# Patient Record
Sex: Male | Born: 1995 | Race: White | Hispanic: No | Marital: Single | Smoking: Never smoker
Health system: Southern US, Community
[De-identification: ages and names within clinical notes are randomized; demographics above are authoritative.]

## PROBLEM LIST (undated history)

## (undated) DIAGNOSIS — Z789 Other specified health status: Secondary | ICD-10-CM

## (undated) HISTORY — PX: NO PAST SURGERIES: SHX2092

---

## 2017-04-07 ENCOUNTER — Telehealth: Payer: Self-pay | Admitting: Sports Medicine

## 2017-04-07 NOTE — Telephone Encounter (Signed)
  [employer] Training Room Note Veverly FellsMichael D. Delorise Shinerigby, DO  [facility] [place] [phone]  Harrell GaveJack Granito - 22 y.o. male MRN [id number]  Date of birth: 06/27/1995  Visit Date: 04/07/17  SUBJECTIVE:  CC: Back pain HPI: 1-1/2 years of low back pain that is worse with increasing tennis activities.  He has worse pain with serving.  He has not changed his stroke recently and has had intermittent issues with this over the past year and a half that responded mildly to chiropractic manipulation in the past.  No prior back issues that he is aware of.  Currently this time the pain is limiting his day-to-day function but in a minimal way; he does report it occasionally keeps him awake at night.    ROS: Negative red flag otherwise per HPI.  OBJECTIVE:  Back is overall well aligned and surprisingly he has very few paraspinal muscle spasms.  He has good lumbar rotation and good paraspinal movement.  While laying in a decubitus position he has markedly tight hip flexors when isolating him at the femoral acetabular joint but he is able to fully extend his knee to greater than 30 degrees of extension due to lumbar hypermobility.  He has significant lower extremity dermatomal dysesthesia.  Mild pain with bilateral stork test that localizes to the right greater than left sides but is present bilaterally.  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  X-rays have been ordered.  Gentle osteopathic manipulation performed today and recommended to begin working on hip hinging exercises.  We will plan to check in with him in 2 weeks and check on his progress and review the x-rays at that time.  If he is having persistent symptoms x-rays are unrevealing could consider  an MRI given the duration of symptoms.  Core stabilization exercises are likely the solution to this and I would like for him to be performing them frequently throughout the day on a daily basis.  Also recommend that he interrupt his prolonged sitting.        Andrena MewsMichael D Rigby, DO    [facility]

## 2017-04-21 ENCOUNTER — Telehealth: Payer: Self-pay | Admitting: Sports Medicine

## 2017-04-21 DIAGNOSIS — M545 Low back pain, unspecified: Secondary | ICD-10-CM

## 2017-04-22 ENCOUNTER — Ambulatory Visit: Payer: Self-pay

## 2017-04-22 ENCOUNTER — Other Ambulatory Visit: Payer: Self-pay | Admitting: Sports Medicine

## 2017-04-22 DIAGNOSIS — R52 Pain, unspecified: Secondary | ICD-10-CM

## 2017-04-25 ENCOUNTER — Other Ambulatory Visit: Payer: Self-pay | Admitting: Sports Medicine

## 2017-04-25 ENCOUNTER — Telehealth: Payer: Self-pay | Admitting: Sports Medicine

## 2017-04-25 DIAGNOSIS — M545 Low back pain, unspecified: Secondary | ICD-10-CM

## 2017-04-25 NOTE — Telephone Encounter (Signed)
Error

## 2017-04-25 NOTE — Telephone Encounter (Signed)
  [employer] Training Room Note Veverly FellsMichael D. Delorise Shinerigby, DO  [facility] [place] [phone]  Harrell GaveJack Chianese - 22 y.o. male MRN [id number]  Date of birth: 10/17/1995  Visit Date: 04/21/2017  PCP: No primary care provider on file.   Referred by: No ref. provider found  SUBJECTIVE:  CC: Left low back pain HPI: Persistent left low back pain that is been present for the past 2 years.  He has tried Journalist, newspaperosteopathic manipulation, chiropractic, therapeutic exercises, modalities in the athletic training room and anti-inflammatory medications.  The pain does seem to be worse when playing tennis and extending backwards.  Plain film x-rays obtained and reviewed as below. ROS: Pt denies any change in bowel or bladder habits, muscle weakness, numbness or falls associated with this pain.  Otherwise per HPI.  OBJECTIVE:  Back is overall well aligned with left paraspinal muscle spasms.  TTP over the thoracolumbar junction pain is most focal over the left paraspinal musculature at the level of L1.  He has a negative straight leg raise bilaterally.  Tight hip flexors left greater than right.  IMAGING & PROCEDURES: Complete lumbar spine films: Obtained [employer] health, uploaded to [employer].  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  2 years of symptoms.  He has tried therapeutic exercises medications and is having persistent worsening pain.  Further diagnostic evaluation with MRI of the lumbar spine with thin slices through L1.  We will plan to follow-up with him after this is obtained.  Meds: No orders of the defined types were placed in this encounter.   Orders: No orders of the defined types were placed in this encounter.        Andrena MewsMichael D Rigby,  DO    [facility]

## 2017-05-02 ENCOUNTER — Ambulatory Visit
Admission: RE | Admit: 2017-05-02 | Discharge: 2017-05-02 | Disposition: A | Discharge status: Home / Self Care | Payer: 59 | Source: Ambulatory Visit | Attending: Sports Medicine | Admitting: Sports Medicine

## 2017-05-02 DIAGNOSIS — M545 Low back pain, unspecified: Secondary | ICD-10-CM

## 2018-10-26 ENCOUNTER — Other Ambulatory Visit: Payer: Self-pay

## 2018-10-26 DIAGNOSIS — Z20822 Contact with and (suspected) exposure to covid-19: Secondary | ICD-10-CM

## 2018-10-28 LAB — NOVEL CORONAVIRUS, NAA: SARS-CoV-2, NAA: NOT DETECTED

## 2018-10-28 LAB — SPECIMEN STATUS REPORT

## 2018-11-08 ENCOUNTER — Emergency Department (HOSPITAL_COMMUNITY): Payer: 59

## 2018-11-08 ENCOUNTER — Emergency Department (HOSPITAL_COMMUNITY)
Admission: EM | Admit: 2018-11-08 | Discharge: 2018-11-09 | Disposition: A | Discharge status: Home / Self Care | Payer: 59 | Attending: Emergency Medicine | Admitting: Emergency Medicine

## 2018-11-08 DIAGNOSIS — M25511 Pain in right shoulder: Secondary | ICD-10-CM | POA: Diagnosis present

## 2018-11-08 DIAGNOSIS — S42021A Displaced fracture of shaft of right clavicle, initial encounter for closed fracture: Secondary | ICD-10-CM | POA: Insufficient documentation

## 2018-11-08 DIAGNOSIS — S42022A Displaced fracture of shaft of left clavicle, initial encounter for closed fracture: Secondary | ICD-10-CM | POA: Diagnosis not present

## 2018-11-08 DIAGNOSIS — Y999 Unspecified external cause status: Secondary | ICD-10-CM | POA: Diagnosis not present

## 2018-11-08 DIAGNOSIS — S40211A Abrasion of right shoulder, initial encounter: Secondary | ICD-10-CM | POA: Insufficient documentation

## 2018-11-08 DIAGNOSIS — Y929 Unspecified place or not applicable: Secondary | ICD-10-CM | POA: Diagnosis not present

## 2018-11-08 DIAGNOSIS — Y9355 Activity, bike riding: Secondary | ICD-10-CM | POA: Insufficient documentation

## 2018-11-08 MED ORDER — ONDANSETRON HCL 4 MG/2ML IJ SOLN
4.0000 mg | Freq: Once | INTRAMUSCULAR | Status: AC
  Administered 2018-11-08: 4 mg via INTRAVENOUS

## 2018-11-08 MED ORDER — ONDANSETRON HCL 4 MG/2ML IJ SOLN
INTRAMUSCULAR | Status: AC
  Filled 2018-11-08: qty 2

## 2018-11-08 MED ORDER — MORPHINE SULFATE (PF) 4 MG/ML IV SOLN
INTRAVENOUS | Status: AC
  Filled 2018-11-08: qty 1

## 2018-11-08 MED ORDER — MORPHINE SULFATE (PF) 4 MG/ML IV SOLN
4.0000 mg | Freq: Once | INTRAVENOUS | Status: AC
  Administered 2018-11-08: 22:00:00 4 mg via INTRAVENOUS

## 2018-11-08 MED ORDER — MORPHINE SULFATE 15 MG PO TABS: 15.0000 mg | ORAL_TABLET | ORAL | 0 refills | Status: AC | PRN

## 2018-11-08 MED ORDER — MORPHINE SULFATE (PF) 4 MG/ML IV SOLN
4.0000 mg | Freq: Once | INTRAVENOUS | Status: AC
  Administered 2018-11-08: 4 mg via INTRAVENOUS
  Filled 2018-11-08: qty 1

## 2018-11-08 NOTE — Discharge Instructions (Addendum)
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
## 2018-11-08 NOTE — ED Notes (Signed)
[phone] Elmyra Ricks pts significant other
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
## 2018-11-09 NOTE — ED Notes (Signed)
Pt discharged from ED; instructions provided and scripts given; Pt encouraged to return to ED if symptoms worsen and to f/u with PCP; Pt verbalized understanding of all instructions 

## 2018-11-13 ENCOUNTER — Other Ambulatory Visit (HOSPITAL_COMMUNITY)
Admission: RE | Admit: 2018-11-13 | Discharge: 2018-11-13 | Disposition: A | Discharge status: Home / Self Care | Payer: 59 | Source: Ambulatory Visit | Attending: Orthopaedic Surgery | Admitting: Orthopaedic Surgery

## 2018-11-13 DIAGNOSIS — Z01812 Encounter for preprocedural laboratory examination: Secondary | ICD-10-CM | POA: Diagnosis not present

## 2018-11-13 DIAGNOSIS — Z20828 Contact with and (suspected) exposure to other viral communicable diseases: Secondary | ICD-10-CM | POA: Insufficient documentation

## 2018-11-14 ENCOUNTER — Encounter (HOSPITAL_COMMUNITY): Payer: Self-pay | Admitting: *Deleted

## 2018-11-14 ENCOUNTER — Other Ambulatory Visit: Payer: Self-pay

## 2018-11-14 LAB — NOVEL CORONAVIRUS, NAA (HOSP ORDER, SEND-OUT TO REF LAB; TAT 18-24 HRS): SARS-CoV-2, NAA: NOT DETECTED

## 2018-11-14 NOTE — Progress Notes (Signed)
Daniel Farrell denies chest pain or shortness of breath.  Patient tested negative for Covid 11/13/2018 and has been in quarantine since. I instructed patient to not eat after midnight Wednesday. I instructed patient the can drink clear liquids until 0900.  We reviewed what clear liquids are. Daniel Farrell states that he can have someone pick up Pre -surgery Ensure tomorrow. I will place instructions with drink.

## 2018-11-14 NOTE — Pre-Procedure Instructions (Signed)
    Daniel Farrell  11/14/2018      Your procedure is scheduled on Thursday, September 17..  Report to [facility], Main Entrance or Entrance "A" at 9:30 A.M.  Call this number if you have problems the morning of surgery: [phone]  This is the number for the Pre- Surgical Desk.     Remember:  Do not eat  after midnight Wednesday. You may drink clear liquids until 9:00 AM .  Clear liquids allowed are:                   Water, Juice (non-citric and without pulp), Carbonated beverages, Clear Tea, Black Coffee only, Plain Jell-O only, Gatorade and Plain Popsicles only  Drink the Pre- Surgery Ensure between 8:30 and 9:00 AM   Take these medicines the morning of surgery with A SIP OF WATER if needed: Tylenol Morphine IR  Wash your body, with antibiotic soap if available. Brush teeth Wear clean clothes to the hospital.  Do not wear jewelry, make-up or nail polish.  Do not wear lotions, powders, or perfumes, or deodorant.   Men may shave face and neck.  Do not bring valuables to the hospital.  [facility] is not responsible for any belongings or valuables.

## 2018-11-15 DIAGNOSIS — S42021A Displaced fracture of shaft of right clavicle, initial encounter for closed fracture: Secondary | ICD-10-CM

## 2018-11-16 ENCOUNTER — Ambulatory Visit (HOSPITAL_COMMUNITY): Payer: 59 | Admitting: Anesthesiology

## 2018-11-16 ENCOUNTER — Encounter (HOSPITAL_COMMUNITY)
Admission: RE | Disposition: A | Discharge status: Home / Self Care | Payer: Self-pay | Source: Home / Self Care | Attending: Orthopaedic Surgery

## 2018-11-16 ENCOUNTER — Ambulatory Visit (HOSPITAL_COMMUNITY): Payer: 59

## 2018-11-16 ENCOUNTER — Other Ambulatory Visit: Payer: Self-pay

## 2018-11-16 ENCOUNTER — Encounter (HOSPITAL_COMMUNITY): Payer: Self-pay | Admitting: *Deleted

## 2018-11-16 ENCOUNTER — Ambulatory Visit (HOSPITAL_COMMUNITY)
Admission: RE | Admit: 2018-11-16 | Discharge: 2018-11-16 | Disposition: A | Discharge status: Home / Self Care | Payer: 59 | Attending: Orthopaedic Surgery | Admitting: Orthopaedic Surgery

## 2018-11-16 DIAGNOSIS — X58XXXA Exposure to other specified factors, initial encounter: Secondary | ICD-10-CM | POA: Diagnosis not present

## 2018-11-16 DIAGNOSIS — S42021A Displaced fracture of shaft of right clavicle, initial encounter for closed fracture: Secondary | ICD-10-CM

## 2018-11-16 DIAGNOSIS — Z419 Encounter for procedure for purposes other than remedying health state, unspecified: Secondary | ICD-10-CM

## 2018-11-16 DIAGNOSIS — Z9889 Other specified postprocedural states: Secondary | ICD-10-CM

## 2018-11-16 HISTORY — DX: Other specified health status: Z78.9

## 2018-11-16 HISTORY — PX: ORIF CLAVICULAR FRACTURE: SHX5055

## 2018-11-16 LAB — BASIC METABOLIC PANEL
Anion gap: 11 (ref 5–15)
BUN: 18 mg/dL (ref 6–20)
CO2: 24 mmol/L (ref 22–32)
Calcium: 9.5 mg/dL (ref 8.9–10.3)
Chloride: 106 mmol/L (ref 98–111)
Creatinine, Ser: 0.77 mg/dL (ref 0.61–1.24)
GFR calc Af Amer: >60 mL/min (ref >60)
GFR calc non Af Amer: >60 mL/min (ref >60)
Glucose, Bld: 79 mg/dL (ref 70–99)
Potassium: 4 mmol/L (ref 3.5–5.1)
Sodium: 141 mmol/L (ref 135–145)

## 2018-11-16 LAB — HEMOGLOBIN: Hemoglobin: 15.4 g/dL (ref 13.0–17.0)

## 2018-11-16 SURGERY — OPEN REDUCTION INTERNAL FIXATION (ORIF) CLAVICULAR FRACTURE
Anesthesia: General | Site: Shoulder | Laterality: Right

## 2018-11-16 MED ORDER — FENTANYL CITRATE (PF) 100 MCG/2ML IJ SOLN
INTRAMUSCULAR | Status: AC
  Filled 2018-11-16: qty 2

## 2018-11-16 MED ORDER — OXYCODONE HCL 5 MG/5ML PO SOLN: 5.0000 mg | Freq: Once | ORAL | Status: AC | PRN

## 2018-11-16 MED ORDER — KETOROLAC TROMETHAMINE 10 MG PO TABS: 10.0000 mg | ORAL_TABLET | Freq: Two times a day (BID) | ORAL | 0 refills | Status: AC | PRN

## 2018-11-16 MED ORDER — ROCURONIUM BROMIDE 10 MG/ML (PF) SYRINGE
PREFILLED_SYRINGE | INTRAVENOUS | Status: DC | PRN
  Administered 2018-11-16: 30 mg via INTRAVENOUS
  Administered 2018-11-16: 10 mg via INTRAVENOUS

## 2018-11-16 MED ORDER — MIDAZOLAM HCL 2 MG/2ML IJ SOLN
INTRAMUSCULAR | Status: AC
  Filled 2018-11-16: qty 2

## 2018-11-16 MED ORDER — ACETAMINOPHEN 10 MG/ML IV SOLN
1000.0000 mg | Freq: Once | INTRAVENOUS | Status: AC
  Administered 2018-11-16: 1000 mg via INTRAVENOUS
  Filled 2018-11-16: qty 100

## 2018-11-16 MED ORDER — CEFAZOLIN SODIUM-DEXTROSE 2-4 GM/100ML-% IV SOLN
INTRAVENOUS | Status: AC
  Filled 2018-11-16: qty 100

## 2018-11-16 MED ORDER — ROCURONIUM BROMIDE 10 MG/ML (PF) SYRINGE
PREFILLED_SYRINGE | INTRAVENOUS | Status: AC
  Filled 2018-11-16: qty 10

## 2018-11-16 MED ORDER — LACTATED RINGERS IV SOLN
INTRAVENOUS | Status: DC
  Administered 2018-11-16 (×2): via INTRAVENOUS

## 2018-11-16 MED ORDER — KETOROLAC TROMETHAMINE 30 MG/ML IJ SOLN
INTRAMUSCULAR | Status: DC | PRN
  Administered 2018-11-16: 30 mg via INTRAVENOUS

## 2018-11-16 MED ORDER — ACETAMINOPHEN 10 MG/ML IV SOLN
INTRAVENOUS | Status: AC
  Filled 2018-11-16: qty 100

## 2018-11-16 MED ORDER — FENTANYL CITRATE (PF) 100 MCG/2ML IJ SOLN
25.0000 ug | INTRAMUSCULAR | Status: DC | PRN
  Administered 2018-11-16: 25 ug via INTRAVENOUS

## 2018-11-16 MED ORDER — OXYCODONE HCL 5 MG PO TABS
5.0000 mg | ORAL_TABLET | Freq: Once | ORAL | Status: AC | PRN
  Administered 2018-11-16: 5 mg via ORAL

## 2018-11-16 MED ORDER — FENTANYL CITRATE (PF) 100 MCG/2ML IJ SOLN
INTRAMUSCULAR | Status: DC | PRN
  Administered 2018-11-16 (×4): 50 ug via INTRAVENOUS

## 2018-11-16 MED ORDER — METHOCARBAMOL 750 MG PO TABS: 750.0000 mg | ORAL_TABLET | Freq: Two times a day (BID) | ORAL | 0 refills | Status: AC | PRN

## 2018-11-16 MED ORDER — ONDANSETRON HCL 4 MG/2ML IJ SOLN
INTRAMUSCULAR | Status: DC | PRN
  Administered 2018-11-16: 4 mg via INTRAVENOUS

## 2018-11-16 MED ORDER — CHLORHEXIDINE GLUCONATE 4 % EX LIQD: 60.0000 mL | Freq: Once | CUTANEOUS | Status: DC

## 2018-11-16 MED ORDER — ONDANSETRON HCL 4 MG/2ML IJ SOLN
INTRAMUSCULAR | Status: AC
  Filled 2018-11-16: qty 2

## 2018-11-16 MED ORDER — ZINC SULFATE 220 (50 ZN) MG PO CAPS: 220.0000 mg | ORAL_CAPSULE | Freq: Every day | ORAL | 0 refills | Status: AC

## 2018-11-16 MED ORDER — PROPOFOL 10 MG/ML IV BOLUS
INTRAVENOUS | Status: AC
  Filled 2018-11-16: qty 40

## 2018-11-16 MED ORDER — ONDANSETRON HCL 4 MG/2ML IJ SOLN: 4.0000 mg | Freq: Once | INTRAMUSCULAR | Status: DC | PRN

## 2018-11-16 MED ORDER — ACETAMINOPHEN 160 MG/5ML PO SOLN: 325.0000 mg | ORAL | Status: DC | PRN

## 2018-11-16 MED ORDER — OXYCODONE HCL 5 MG PO TABS: 5.0000 mg | ORAL_TABLET | Freq: Three times a day (TID) | ORAL | 0 refills | Status: AC | PRN

## 2018-11-16 MED ORDER — MIDAZOLAM HCL 5 MG/5ML IJ SOLN
INTRAMUSCULAR | Status: DC | PRN
  Administered 2018-11-16: 2 mg via INTRAVENOUS

## 2018-11-16 MED ORDER — DEXAMETHASONE SODIUM PHOSPHATE 10 MG/ML IJ SOLN
INTRAMUSCULAR | Status: DC | PRN
  Administered 2018-11-16: 10 mg via INTRAVENOUS

## 2018-11-16 MED ORDER — OXYCODONE HCL 5 MG PO TABS
ORAL_TABLET | ORAL | Status: AC
  Filled 2018-11-16: qty 1

## 2018-11-16 MED ORDER — DEXAMETHASONE SODIUM PHOSPHATE 10 MG/ML IJ SOLN
INTRAMUSCULAR | Status: AC
  Filled 2018-11-16: qty 1

## 2018-11-16 MED ORDER — FENTANYL CITRATE (PF) 250 MCG/5ML IJ SOLN
INTRAMUSCULAR | Status: AC
  Filled 2018-11-16: qty 5

## 2018-11-16 MED ORDER — LIDOCAINE 2% (20 MG/ML) 5 ML SYRINGE
INTRAMUSCULAR | Status: DC | PRN
  Administered 2018-11-16: 80 mg via INTRAVENOUS

## 2018-11-16 MED ORDER — BUPIVACAINE-EPINEPHRINE 0.25% -1:200000 IJ SOLN
INTRAMUSCULAR | Status: AC
  Filled 2018-11-16: qty 1

## 2018-11-16 MED ORDER — SUGAMMADEX SODIUM 200 MG/2ML IV SOLN
INTRAVENOUS | Status: DC | PRN
  Administered 2018-11-16: 150 mg via INTRAVENOUS

## 2018-11-16 MED ORDER — 0.9 % SODIUM CHLORIDE (POUR BTL) OPTIME
TOPICAL | Status: DC | PRN
  Administered 2018-11-16 (×3): 1000 mL

## 2018-11-16 MED ORDER — CEFAZOLIN SODIUM-DEXTROSE 2-4 GM/100ML-% IV SOLN
2.0000 g | INTRAVENOUS | Status: AC
  Administered 2018-11-16: 12:00:00 2 g via INTRAVENOUS

## 2018-11-16 MED ORDER — KETOROLAC TROMETHAMINE 30 MG/ML IJ SOLN
INTRAMUSCULAR | Status: AC
  Filled 2018-11-16: qty 1

## 2018-11-16 MED ORDER — ACETAMINOPHEN 325 MG PO TABS: 325.0000 mg | ORAL_TABLET | ORAL | Status: DC | PRN

## 2018-11-16 MED ORDER — BUPIVACAINE-EPINEPHRINE 0.25% -1:200000 IJ SOLN
INTRAMUSCULAR | Status: DC | PRN
  Administered 2018-11-16: 30 mL

## 2018-11-16 MED ORDER — PROPOFOL 10 MG/ML IV BOLUS
INTRAVENOUS | Status: DC | PRN
  Administered 2018-11-16: 20 mg via INTRAVENOUS
  Administered 2018-11-16: 200 mg via INTRAVENOUS

## 2018-11-16 MED ORDER — MEPERIDINE HCL 25 MG/ML IJ SOLN: 6.2500 mg | INTRAMUSCULAR | Status: DC | PRN

## 2018-11-16 MED ORDER — LIDOCAINE 2% (20 MG/ML) 5 ML SYRINGE
INTRAMUSCULAR | Status: AC
  Filled 2018-11-16: qty 5

## 2018-11-16 MED ORDER — CALCIUM CARBONATE-VITAMIN D 500-200 MG-UNIT PO TABS: 1.0000 | ORAL_TABLET | Freq: Three times a day (TID) | ORAL | 6 refills | Status: AC

## 2018-11-16 MED ORDER — LACTATED RINGERS IV SOLN: INTRAVENOUS | Status: DC

## 2018-11-16 SURGICAL SUPPLY — 50 items
BIT DRILL CAL (BIT) IMPLANT
BIT DRILL CLAV 2.2X135 (BIT) ×4
BIT DRILL CLAV LONG 2.2X135 (BIT) IMPLANT
CLOSURE STERI-STRIP 1/2X4 (GAUZE/BANDAGES/DRESSINGS) ×1
CLOSURE WOUND 1/2 X4 (GAUZE/BANDAGES/DRESSINGS) ×1
CLSR STERI-STRIP ANTIMIC 1/2X4 (GAUZE/BANDAGES/DRESSINGS) ×1 IMPLANT
COVER SURGICAL LIGHT HANDLE (MISCELLANEOUS) ×3 IMPLANT
COVER WAND RF STERILE (DRAPES) ×3 IMPLANT
DRAPE C-ARM 42X72 X-RAY (DRAPES) ×3 IMPLANT
DRAPE U-SHAPE 47X51 STRL (DRAPES) ×3 IMPLANT
DRILL BIT CAL (BIT) ×3
DRSG TEGADERM 4X4.75 (GAUZE/BANDAGES/DRESSINGS) ×5 IMPLANT
DURAPREP 26ML APPLICATOR (WOUND CARE) ×5 IMPLANT
ELECT CAUTERY BLADE 6.4 (BLADE) ×3 IMPLANT
ELECT REM PT RETURN 9FT ADLT (ELECTROSURGICAL) ×3
ELECTRODE REM PT RTRN 9FT ADLT (ELECTROSURGICAL) ×1 IMPLANT
GAUZE SPONGE 4X4 12PLY STRL (GAUZE/BANDAGES/DRESSINGS) ×2 IMPLANT
GAUZE XEROFORM 1X8 LF (GAUZE/BANDAGES/DRESSINGS) ×3 IMPLANT
GLOVE BIOGEL PI IND STRL 7.0 (GLOVE) ×1 IMPLANT
GLOVE BIOGEL PI INDICATOR 7.0 (GLOVE) ×2
GLOVE ECLIPSE 7.0 STRL STRAW (GLOVE) ×3 IMPLANT
GLOVE SKINSENSE NS SZ7.5 (GLOVE) ×4
GLOVE SKINSENSE STRL SZ7.5 (GLOVE) ×2 IMPLANT
GOWN STRL REIN XL XLG (GOWN DISPOSABLE) ×6 IMPLANT
K-WIRE TROCHAR TIP ALPS 1.6 (WIRE) ×6
KIT BASIN OR (CUSTOM PROCEDURE TRAY) ×3 IMPLANT
KIT TURNOVER KIT B (KITS) ×3 IMPLANT
KWIRE TROCHAR TIP ALPS 1.6 (WIRE) IMPLANT
MANIFOLD NEPTUNE II (INSTRUMENTS) ×3 IMPLANT
NDL HYPO 25GX1X1/2 BEV (NEEDLE) IMPLANT
NEEDLE HYPO 25GX1X1/2 BEV (NEEDLE) ×3 IMPLANT
NS IRRIG 1000ML POUR BTL (IV SOLUTION) ×7 IMPLANT
PACK SHOULDER (CUSTOM PROCEDURE TRAY) ×3 IMPLANT
PAD ARMBOARD 7.5X6 YLW CONV (MISCELLANEOUS) ×6 IMPLANT
PLATE CLAV SUP 110 10H RT (Plate) ×2 IMPLANT
SCREW CORT LP 3.5X14 (Screw) ×6 IMPLANT
SCREW CORT LP T15 3.5X16 (Screw) ×2 IMPLANT
SCREW LOCK CORT STAR 3.5X14 (Screw) ×2 IMPLANT
SCREW LOCK MD 3.5X14 NS (Screw) ×4 IMPLANT
SCREW TIS LP 3.5X18 NS (Screw) ×2 IMPLANT
STRIP CLOSURE SKIN 1/2X4 (GAUZE/BANDAGES/DRESSINGS) ×2 IMPLANT
SUCTION FRAZIER HANDLE 10FR (MISCELLANEOUS) ×2
SUCTION TUBE FRAZIER 10FR DISP (MISCELLANEOUS) ×1 IMPLANT
SUT MNCRL AB 4-0 PS2 18 (SUTURE) ×3 IMPLANT
SUT VIC AB 0 CT1 27 (SUTURE) ×2
SUT VIC AB 0 CT1 27XBRD ANBCTR (SUTURE) IMPLANT
SUT VIC AB 2-0 CT1 27 (SUTURE) ×4
SUT VIC AB 2-0 CT1 TAPERPNT 27 (SUTURE) ×1 IMPLANT
SYR CONTROL 10ML LL (SYRINGE) IMPLANT
UNDERPAD 30X30 (UNDERPADS AND DIAPERS) ×3 IMPLANT

## 2018-11-16 NOTE — H&P (Addendum)
    PREOPERATIVE H&P  Chief Complaint: Right clavicle fracture  HPI: Daniel Farrell is a 23 y.o. male who presents for surgical treatment of Right clavicle fracture.  He denies any changes in medical history.  No past medical history on file.  Social History   Socioeconomic History  . Marital status: Single    Spouse name: Not on file  . Number of children: Not on file  . Years of education: Not on file  . Highest education level: Not on file  Occupational History  . Not on file  Social Needs  . Financial resource strain: Not on file  . Food insecurity    Worry: Not on file    Inability: Not on file  . Transportation needs    Medical: Not on file    Non-medical: Not on file  Tobacco Use  . Smoking status: Never Smoker  . Smokeless tobacco: Never Used  Substance and Sexual Activity  . Alcohol use: Yes    Alcohol/week: 5.0 standard drinks    Types: 5 Cans of beer per week    Comment: 11/14/2018 - none in 2 weeks  . Drug use: Never  . Sexual activity: Not on file  Lifestyle  . Physical activity    Days per week: Not on file    Minutes per session: Not on file  . Stress: Not on file  Relationships  . Social Herbalist on phone: Not on file    Gets together: Not on file    Attends religious service: Not on file    Active member of club or organization: Not on file    Attends meetings of clubs or organizations: Not on file    Relationship status: Not on file  Other Topics Concern  . Not on file  Social History Narrative  . Not on file   No family history on file. No Known Allergies Prior to Admission medications   Medication Sig Start Date End Date Taking? Authorizing Provider  acetaminophen (TYLENOL) 500 MG tablet Take 1,000 mg by mouth every 6 (six) hours as needed for moderate pain.   Yes [provider]  morphine (MSIR) 15 MG tablet Take 1 tablet (15 mg total) by mouth every 4 (four) hours as needed for severe pain. 11/08/18  Yes [name],  DO     Positive ROS: All other systems have been reviewed and were otherwise negative with the exception of those mentioned in the HPI and as above.  Physical Exam: General: Alert, no acute distress Cardiovascular: No pedal edema Respiratory: No cyanosis, no use of accessory musculature GI: abdomen soft Skin: tenting of skin overlying fracture fragment Neurologic: Sensation intact distally Psychiatric: Patient is competent for consent with normal mood and affect Lymphatic: no lymphedema  MUSCULOSKELETAL: exam stable  Assessment: Right clavicle fracture with skin tenting  Plan:  Plan for Procedure(s): OPEN REDUCTION INTERNAL FIXATION (ORIF) RIGHT CLAVICLE FRACTURE  With the development of skin tenting and concern for skin necrosis we need to move forward with ORIF today without any delay.  The risks benefits and alternatives were discussed with the patient including but not limited to the risks of nonoperative treatment, versus surgical intervention including infection, bleeding, nerve injury,  blood clots, cardiopulmonary complications, morbidity, mortality, among others, and they were willing to proceed.   Eduard Roux, MD   11/16/2018 6:37 AM

## 2018-11-16 NOTE — Anesthesia Procedure Notes (Signed)
Procedure Name: Intubation Date/Time: 11/16/2018 12:14 PM Performed by: Jenne Campus, CRNA Pre-anesthesia Checklist: Patient identified, Emergency Drugs available, Suction available and Patient being monitored Patient Re-evaluated:Patient Re-evaluated prior to induction Oxygen Delivery Method: Circle System Utilized Preoxygenation: Pre-oxygenation with 100% oxygen Induction Type: IV induction Ventilation: Mask ventilation without difficulty Laryngoscope Size: Miller and 3 Grade View: Grade I Tube type: Oral Tube size: 7.5 mm Number of attempts: 1 Airway Equipment and Method: Stylet and Oral airway Placement Confirmation: ETT inserted through vocal cords under direct vision,  positive ETCO2 and breath sounds checked- equal and bilateral Secured at: 21 cm Tube secured with: Tape Dental Injury: Teeth and Oropharynx as per pre-operative assessment

## 2018-11-16 NOTE — Discharge Instructions (Signed)
   Postoperative instructions:  Weightbearing instructions: non weight bearing  Keep your dressing and/or splint clean and dry at all times.  You can remove your dressing on post-operative day #3 and change with a dry/sterile dressing or Band-Aids as needed thereafter.    Incision instructions:  Do not soak your incision for 3 weeks after surgery.  If the incision gets wet, pat dry and do not scrub the incision.  Pain control:  You have been given a prescription to be taken as directed for post-operative pain control.  In addition, elevate the operative extremity above the heart at all times to prevent swelling and throbbing pain.  Take over-the-counter Colace, 100mg by mouth twice a day while taking narcotic pain medications to help prevent constipation.  Follow up appointments: 1) 14 days for suture removal and wound check. 2) Dr. Craig Wisnewski as scheduled.   -------------------------------------------------------------------------------------------------------------  After Surgery Pain Control:  After your surgery, post-surgical discomfort or pain is likely. This discomfort can last several days to a few weeks. At certain times of the day your discomfort may be more intense.  Did you receive a nerve block?  A nerve block can provide pain relief for one hour to two days after your surgery. As long as the nerve block is working, you will experience little or no sensation in the area the surgeon operated on.  As the nerve block wears off, you will begin to experience pain or discomfort. It is very important that you begin taking your prescribed pain medication before the nerve block fully wears off. Treating your pain at the first sign of the block wearing off will ensure your pain is better controlled and more tolerable when full-sensation returns. Do not wait until the pain is intolerable, as the medicine will be less effective. It is better to treat pain in advance than to try and catch up.    General Anesthesia:  If you did not receive a nerve block during your surgery, you will need to start taking your pain medication shortly after your surgery and should continue to do so as prescribed by your surgeon.  Pain Medication:  Most commonly we prescribe Vicodin and Percocet for post-operative pain. Both of these medications contain a combination of acetaminophen (Tylenol) and a narcotic to help control pain.   It takes between 30 and 45 minutes before pain medication starts to work. It is important to take your medication before your pain level gets too intense.   Nausea is a common side effect of many pain medications. You will want to eat something before taking your pain medicine to help prevent nausea.   If you are taking a prescription pain medication that contains acetaminophen, we recommend that you do not take additional over the counter acetaminophen (Tylenol).  Other pain relieving options:   Using a cold pack to ice the affected area a few times a day (15 to 20 minutes at a time) can help to relieve pain, reduce swelling and bruising.   Elevation of the affected area can also help to reduce pain and swelling.  

## 2018-11-16 NOTE — Transfer of Care (Signed)
Immediate Anesthesia Transfer of Care Note  Patient: Daniel Farrell  Procedure(s) Performed: OPEN REDUCTION INTERNAL FIXATION (ORIF) RIGHT CLAVICLE FRACTURE (Right Shoulder)  Patient Location: PACU  Anesthesia Type:General  Level of Consciousness: awake, oriented and patient cooperative  Airway & Oxygen Therapy: Patient Spontanous Breathing and Patient connected to nasal cannula oxygen  Post-op Assessment: Report given to RN and Post -op Vital signs reviewed and stable  Post vital signs: Reviewed  Last Vitals:  Vitals Value Taken Time  BP 135/64 11/16/18 1426  Temp    Pulse 79 11/16/18 1427  Resp 15 11/16/18 1427  SpO2 100 % 11/16/18 1427  Vitals shown include unvalidated device data.  Last Pain:  Vitals:   11/16/18 1028  TempSrc:   PainSc: 4       Patients Stated Pain Goal: 7 ([id number])  Complications: No apparent anesthesia complications

## 2018-11-16 NOTE — Op Note (Signed)
   Date of Surgery: 11/16/2018  INDICATIONS: Daniel Farrell is a 23 y.o.-year-old male who sustained a right clavicle fracture;  The patient did consent to the procedure after discussion of the risks and benefits.  PREOPERATIVE DIAGNOSIS: Right displaced clavicle shaft fracture with skin tenting  POSTOPERATIVE DIAGNOSIS: Same.  PROCEDURE: Open treatment of right clavicle fracture with internal fixation  SURGEON: N. Eduard Roux, M.D.  ASSIST: Ciro Backer [place], [place]; necessary for the timely completion of procedure and due to complexity of procedure..  ANESTHESIA:  general  IV FLUIDS AND URINE: See anesthesia.  ESTIMATED BLOOD LOSS: minimal mL.  IMPLANTS: [employer] superior plate 10 hole  COMPLICATIONS: None.  DESCRIPTION OF PROCEDURE: The patient was brought to the operating room and placed supine on the operating table.  The patient had been signed prior to the procedure and this was documented. The patient had the anesthesia placed by the anesthesiologist.  The patient was then placed in the beach chair position.  All bony prominences were well padded.  A time-out was performed to confirm that this was the correct patient, site, side and location. The patient had an SCD on the lower extremities. The patient did receive antibiotics prior to the incision and was re-dosed during the procedure as needed at indicated intervals.  The patient had the operative extremity prepped and draped in the standard surgical fashion.    A horizontal incision based over the clavicle was used.  Cutaneous nerves were identified and protected as much as possible.  Full thickness flaps were elevated off of the clavicle.  The fracture was exposed.  Any organized hematoma and entrapped periosteum was retrieved from the fracture site. Reduction of the main fracture was obtained using clamps and a lag screw was placed using standard AO technique.  A superior plate was applied to the clavicle.  The appropriate  length was found by using fluoroscopy.  With the plate in the appropriate position and the fracture reduced.  Two nonlocking screws were placed through the plate and into the clavicle which contoured well.  Care was taken not to plunge with any of the instruments.  Retractors were used as added protection to the neurovascular and pulmonary structures.  Additional nonlocking and locking screws were placed through the plate each with excellent purchase.  Final fluoroscopy pictures were taken to confirm plate placement and fracture reduction.  The wound was thoroughly irrigated and closed in a layer fashion using 0 vicryl, 2.0 vicryl and 4.0 monocryl.  Sterile dressings were applied and the patient was extubated and transferred to the PACU in stable condition.  POSTOPERATIVE PLAN: Patient will be in a sling for comfort.  He is allowed to range his shoulder up to the level of the shoulder and not allowed to lift anything.  Observation overnight for pain control and discharge home in the morning.  Azucena Cecil, MD [phone] 1:46 PM

## 2018-11-16 NOTE — Anesthesia Postprocedure Evaluation (Signed)
Anesthesia Post Note  Patient: Daniel Farrell  Procedure(s) Performed: OPEN REDUCTION INTERNAL FIXATION (ORIF) RIGHT CLAVICLE FRACTURE (Right Shoulder)     Patient location during evaluation: PACU Anesthesia Type: General Level of consciousness: awake Pain management: pain level controlled Vital Signs Assessment: post-procedure vital signs reviewed and stable Respiratory status: spontaneous breathing Cardiovascular status: stable Postop Assessment: no apparent nausea or vomiting Anesthetic complications: no    Last Vitals:  Vitals:   11/16/18 1424 11/16/18 1438  BP: 135/64 128/72  Pulse: 88 66  Resp: 13 12  Temp: 36.5 C   SpO2: 98% 100%    Last Pain:  Vitals:   11/16/18 1438  TempSrc:   PainSc: 7                  Larry Alcock

## 2018-11-16 NOTE — Anesthesia Preprocedure Evaluation (Addendum)
Anesthesia Evaluation  Patient identified by MRN, date of birth, ID band Patient awake    Reviewed: Allergy & Precautions, NPO status , Patient's Chart, lab work & pertinent test results  Airway Mallampati: I  TM Distance: >3 FB Neck ROM: Full    Dental no notable dental hx. (+) Teeth Intact, Dental Advisory Given   Pulmonary neg pulmonary ROS,    Pulmonary exam normal breath sounds clear to auscultation       Cardiovascular negative cardio ROS Normal cardiovascular exam Rhythm:Regular Rate:Normal     Neuro/Psych negative neurological ROS  negative psych ROS   GI/Hepatic negative GI ROS, Neg liver ROS,   Endo/Other  negative endocrine ROS  Renal/GU negative Renal ROS  negative genitourinary   Musculoskeletal negative musculoskeletal ROS (+)   Abdominal   Peds negative pediatric ROS (+)  Hematology negative hematology ROS (+)   Anesthesia Other Findings   Reproductive/Obstetrics negative OB ROS                             Anesthesia Physical Anesthesia Plan  ASA: I and emergent  Anesthesia Plan: General   Post-op Pain Management:    Induction: Intravenous  PONV Risk Score and Plan: 2 and Ondansetron, Treatment may vary due to age or medical condition and Dexamethasone  Airway Management Planned: LMA  Additional Equipment:   Intra-op Plan:   Post-operative Plan: Extubation in OR  Informed Consent:   Plan Discussed with: Anesthesiologist, CRNA and Surgeon  Anesthesia Plan Comments: (  )       Anesthesia Quick Evaluation

## 2018-11-21 ENCOUNTER — Encounter (HOSPITAL_COMMUNITY): Payer: Self-pay | Admitting: Orthopaedic Surgery

## 2018-12-01 ENCOUNTER — Ambulatory Visit (INDEPENDENT_AMBULATORY_CARE_PROVIDER_SITE_OTHER): Payer: 59 | Admitting: Orthopaedic Surgery

## 2018-12-01 ENCOUNTER — Encounter: Payer: Self-pay | Admitting: Orthopaedic Surgery

## 2018-12-01 ENCOUNTER — Ambulatory Visit (INDEPENDENT_AMBULATORY_CARE_PROVIDER_SITE_OTHER): Payer: 59

## 2018-12-01 DIAGNOSIS — S42021A Displaced fracture of shaft of right clavicle, initial encounter for closed fracture: Secondary | ICD-10-CM | POA: Diagnosis not present

## 2018-12-01 NOTE — Progress Notes (Signed)
   Post-Op Visit Note   Patient: Daniel Farrell           Date of Birth: 1995/03/26           MRN: [id number] Visit Date: 12/01/2018 PCP: Patient, No Pcp Per   Assessment & Plan:  Chief Complaint:  Chief Complaint  Patient presents with  . Right Shoulder - Routine Post Op    CLAVICLE   Visit Diagnoses:  1. Displaced fracture of shaft of right clavicle, initial encounter for closed fracture     Plan: Daniel Farrell is two-week status post ORIF right clavicle fracture.  He is overall doing well and has very little pain.  He does have a numbness around the incision.  Denies any drainage or signs or symptoms of infection.  He is able to raise his arm up to the level of the shoulder without pain.  His surgical incision is healed without signs of infection.  There is minimal swelling.  No neurovascular compromise.  X-rays demonstrate stable plate fixation alignment of the fracture.  From my standpoint he is doing well.  He can continue to perform gentle range of motion of his shoulder and arm.  Continue taking zinc and calcium.  He will likely move back to [place] permanently around Christmas time but he will be able to come back for his next visit in 4 weeks.  He will need 2 view x-rays of the right clavicle.  Anticipate beginning PT at that time.  Questions encouraged and answered.  Follow-Up Instructions: Return in about 4 weeks (around 12/29/2018).   Orders:  Orders Placed This Encounter  Procedures  . XR Clavicle Right   No orders of the defined types were placed in this encounter.   Imaging: Xr Clavicle Right  Result Date: 12/01/2018 Stable plate fixation and alignment of clavicle fracture   PMFS History: Patient Active Problem List   Diagnosis Date Noted  . Displaced fracture of shaft of right clavicle, initial encounter for closed fracture 11/15/2018  . Lumbar back pain 04/25/2017   Past Medical History:  Diagnosis Date  . Medical history non-contributory     History reviewed. No  pertinent family history.  Past Surgical History:  Procedure Laterality Date  . NO PAST SURGERIES    . ORIF CLAVICULAR FRACTURE Right 11/16/2018   Procedure: OPEN REDUCTION INTERNAL FIXATION (ORIF) RIGHT CLAVICLE FRACTURE;  Surgeon: Leandrew Koyanagi, MD;  Location: [facility];  Service: Orthopedics;  Laterality: Right;   Social History   Occupational History  . Not on file  Tobacco Use  . Smoking status: Never Smoker  . Smokeless tobacco: Never Used  Substance and Sexual Activity  . Alcohol use: Yes    Alcohol/week: 5.0 standard drinks    Types: 5 Cans of beer per week    Comment: 11/14/2018 - none in 2 weeks  . Drug use: Never  . Sexual activity: Not on file

## 2018-12-29 ENCOUNTER — Other Ambulatory Visit: Payer: Self-pay

## 2018-12-29 ENCOUNTER — Ambulatory Visit (INDEPENDENT_AMBULATORY_CARE_PROVIDER_SITE_OTHER): Payer: 59 | Admitting: Orthopaedic Surgery

## 2018-12-29 ENCOUNTER — Encounter: Payer: Self-pay | Admitting: Orthopaedic Surgery

## 2018-12-29 ENCOUNTER — Ambulatory Visit (INDEPENDENT_AMBULATORY_CARE_PROVIDER_SITE_OTHER): Payer: 59

## 2018-12-29 DIAGNOSIS — S42021A Displaced fracture of shaft of right clavicle, initial encounter for closed fracture: Secondary | ICD-10-CM

## 2018-12-29 NOTE — Progress Notes (Signed)
   Post-Op Visit Note   Patient: Daniel Farrell           Date of Birth: 1995-05-08           MRN: [id number] Visit Date: 12/29/2018 PCP: Patient, No Pcp Per   Assessment & Plan:  Chief Complaint:  Chief Complaint  Patient presents with  . Right Shoulder - Pain   Visit Diagnoses:  1. Displaced fracture of shaft of right clavicle, initial encounter for closed fracture     Plan: Daniel Farrell is 6 weeks status post ORIF right clavicle fracture.  He is overall doing well.  He denies any significant pain.  He is taking zinc and calcium as prescribed.  Surgical scar is healed.  There is a small suture knot that spitting out which we removed today.  There is no signs of infection.  He is able to actively elevate his arm up to the level of the shoulder.  His x-rays demonstrate stable fixation alignment fracture evidence of early healing.  At this time I would like to send him to outpatient PT for strengthening and progressive range of motion.  He is weight-bear as tolerated.  He is moving to [place] at the end of this month for a job and he understands that he needs to follow-up with an orthopedic surgeon in about 6 weeks from now.  Questions encouraged and answered.  Follow-Up Instructions: Return if symptoms worsen or fail to improve.   Orders:  Orders Placed This Encounter  Procedures  . XR Clavicle Right   No orders of the defined types were placed in this encounter.   Imaging: Xr Clavicle Right  Result Date: 12/29/2018 Stable plate fixation and alignment of the clavicle fracture.  There is evidence of early bony consolidation.  Overall fracture lucency is decreased.   PMFS History: Patient Active Problem List   Diagnosis Date Noted  . Displaced fracture of shaft of right clavicle, initial encounter for closed fracture 11/15/2018  . Lumbar back pain 04/25/2017   Past Medical History:  Diagnosis Date  . Medical history non-contributory     History reviewed. No pertinent family  history.  Past Surgical History:  Procedure Laterality Date  . NO PAST SURGERIES    . ORIF CLAVICULAR FRACTURE Right 11/16/2018   Procedure: OPEN REDUCTION INTERNAL FIXATION (ORIF) RIGHT CLAVICLE FRACTURE;  Surgeon: Leandrew Koyanagi, MD;  Location: [facility];  Service: Orthopedics;  Laterality: Right;   Social History   Occupational History  . Not on file  Tobacco Use  . Smoking status: Never Smoker  . Smokeless tobacco: Never Used  Substance and Sexual Activity  . Alcohol use: Yes    Alcohol/week: 5.0 standard drinks    Types: 5 Cans of beer per week    Comment: 11/14/2018 - none in 2 weeks  . Drug use: Never  . Sexual activity: Not on file

## 2021-01-12 IMAGING — DX DG CLAVICLE*R*
2 series · 2 of 2 positions shown · non-contrast
Comparison: None.

CLINICAL DATA: Trauma

EXAM:
RIGHT CLAVICLE - 2+ VIEWS

[clavicle ap]
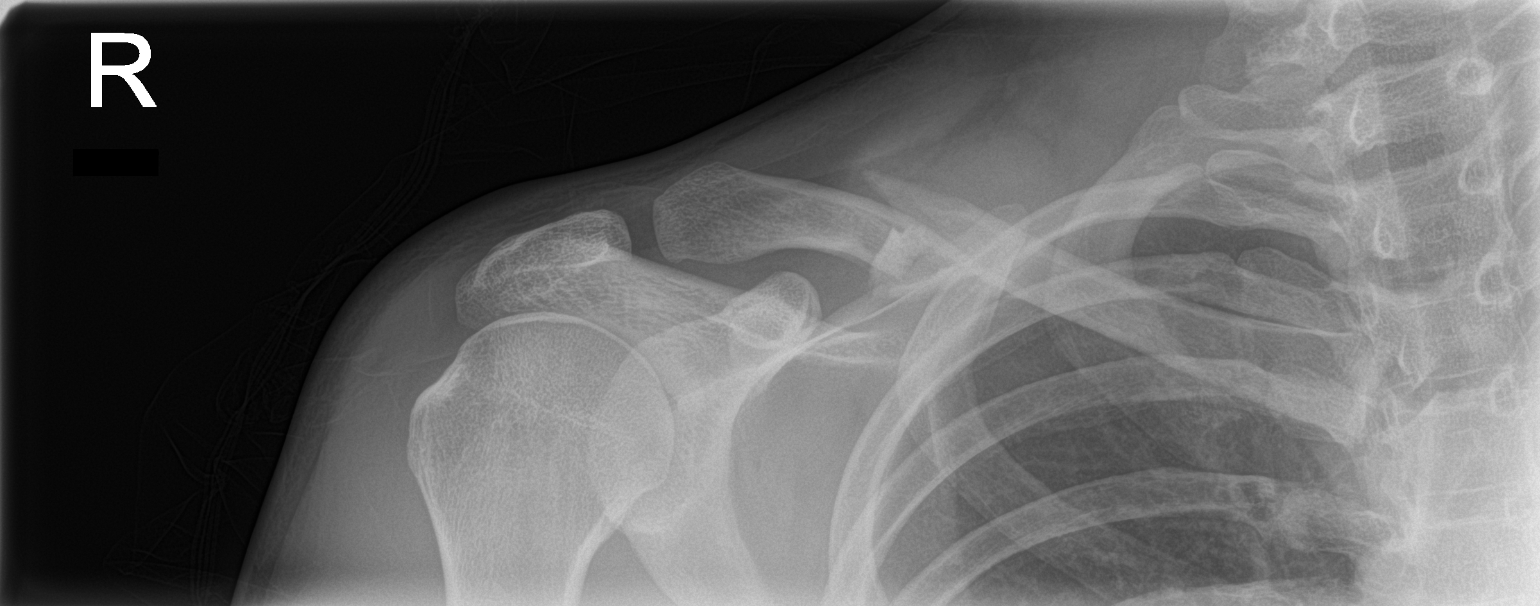

[clavicle axial]
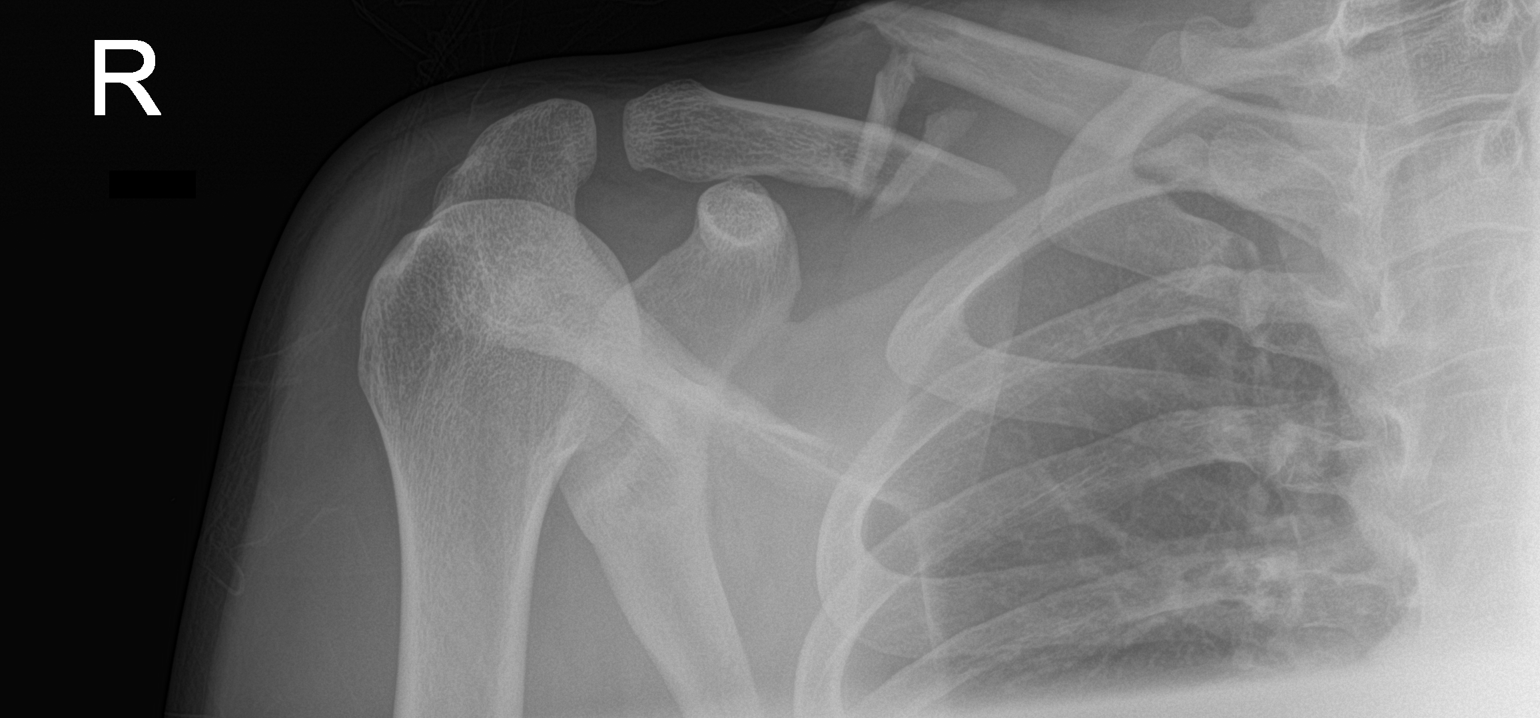

[2 of 2 positions shown; findings below may reference images not displayed]

FINDINGS: Acute comminuted fracture involving the mid to distal shaft of the
right clavicle with greater than 1 bone with inferior displacement
of distal fracture fragment. About 3.7 cm of overriding. At least 2
bone fragments oriented cranial to caudal at the site of fracture.
IMPRESSION: Acute comminuted and markedly displaced mid to distal clavicle
fracture with overriding.

## 2021-01-20 IMAGING — DX DG CHEST 1V PORT
1 series · 2 of 2 positions shown · non-contrast
Comparison: Radiograph November 08, 2018.

CLINICAL DATA: Status post open reduction and internal fixation of
right clavicular fracture.

EXAM:
PORTABLE CHEST 1 VIEW

[Series 1: chest · 0.14mm/px · 2 of 2 slices shown]
[im 1/2]
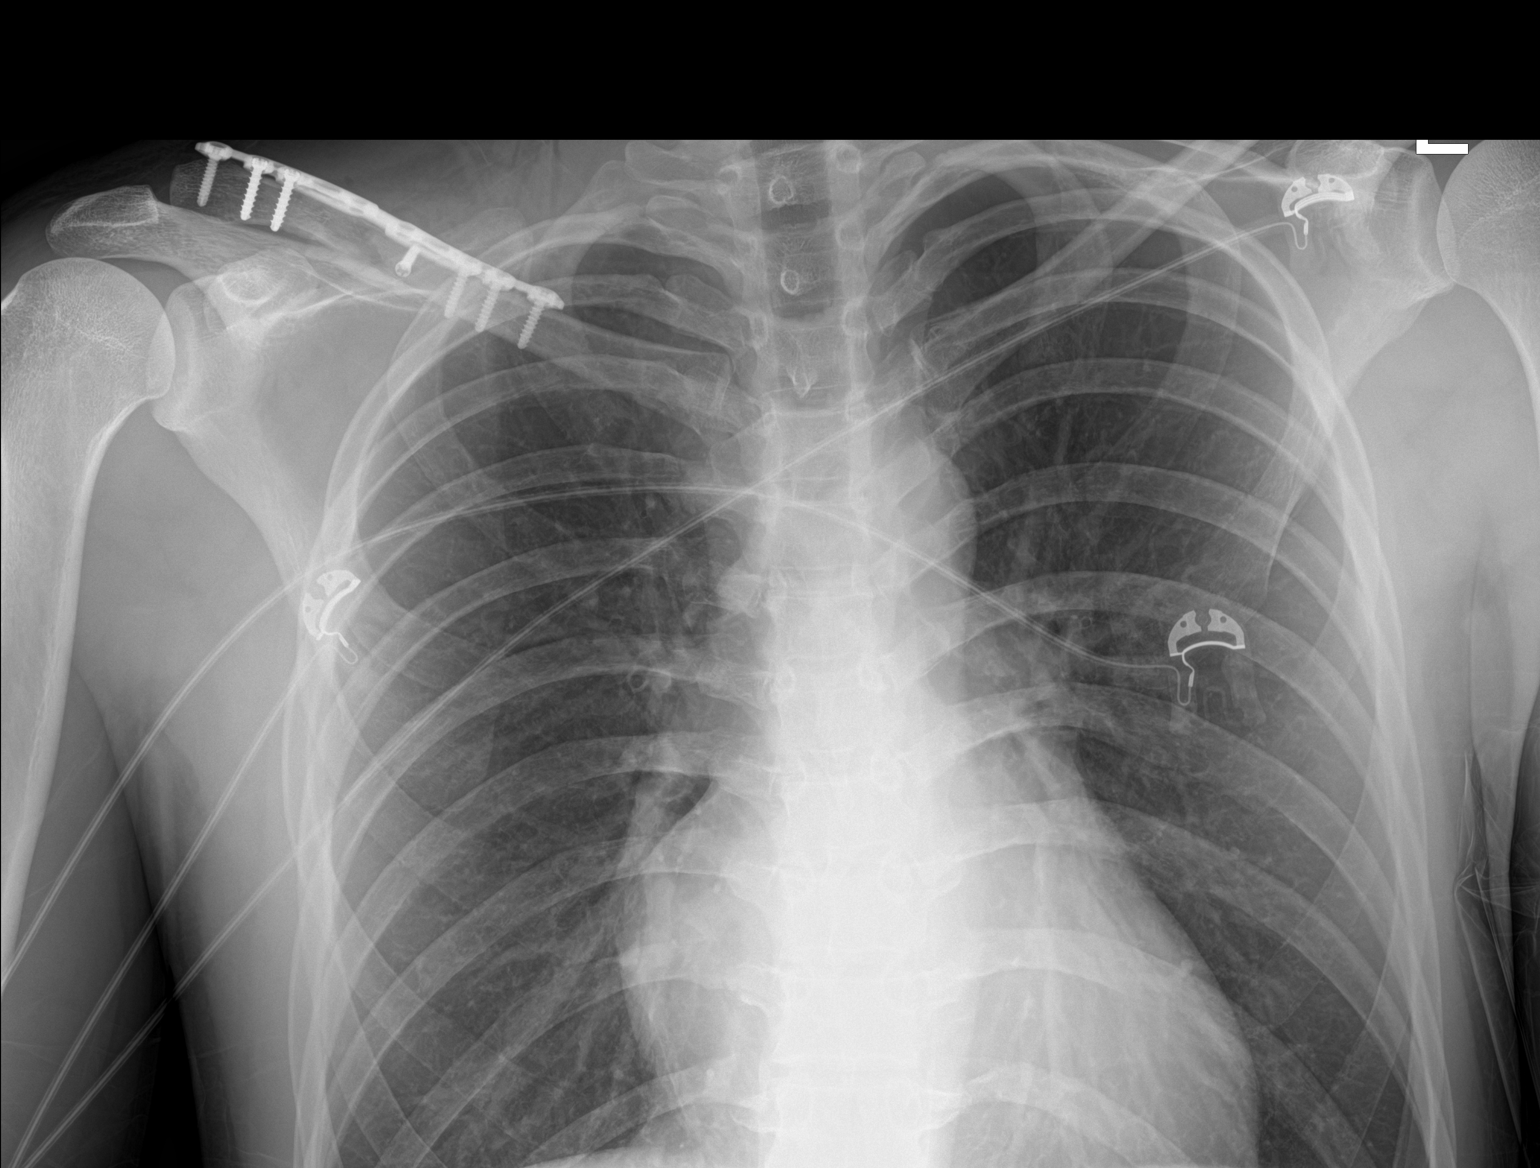
[im 2/2]
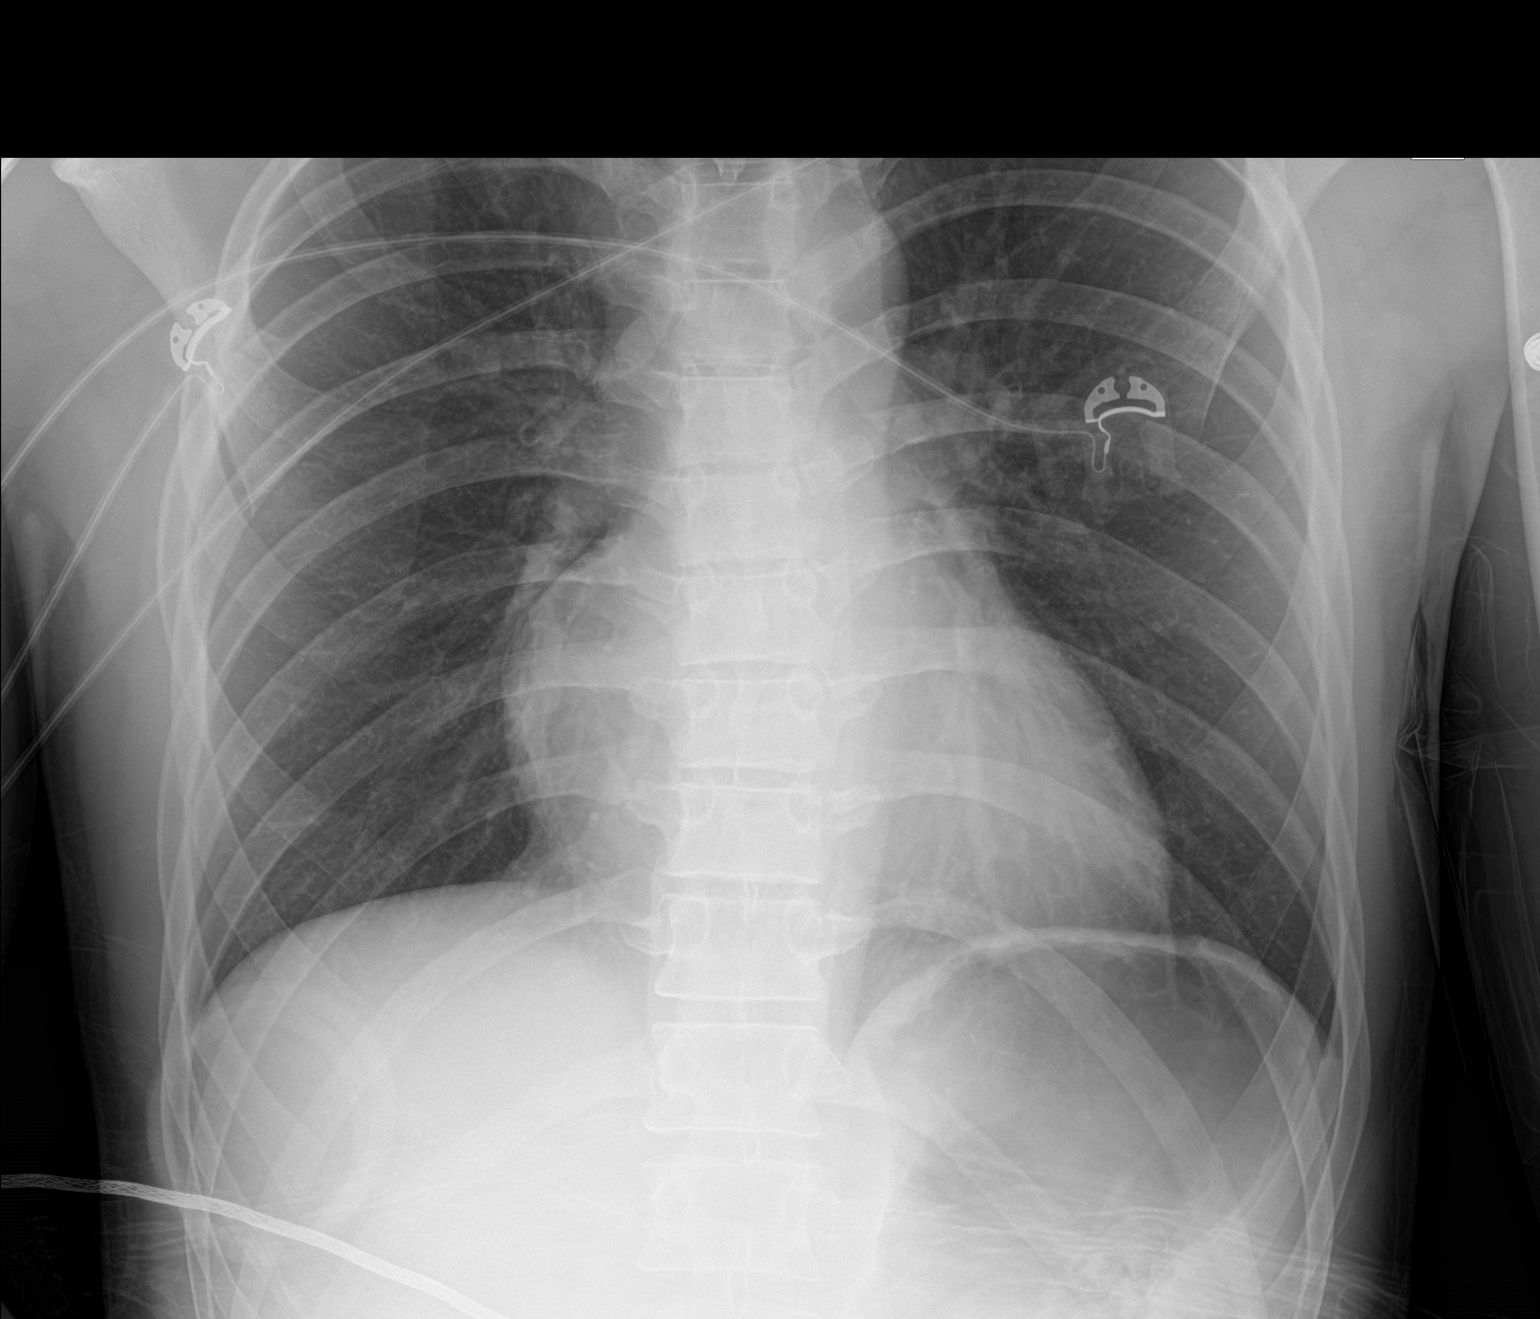

[2 of 2 positions shown; findings below may reference images not displayed]

FINDINGS: The heart size and mediastinal contours are within normal limits.
Both lungs are clear. Status post surgical internal fixation of
distal right clavicular fracture with good alignment of fracture
components.
IMPRESSION: No acute cardiopulmonary abnormality seen.

## 2021-01-20 IMAGING — RF DG CLAVICLE*R*
1 series · 4 of 4 positions shown · non-contrast
Comparison: None.

CLINICAL DATA: Repair of right clavicular fracture.

FLUOROSCOPY TIME:  27 seconds.
Images: 4
EXAM:
RIGHT CLAVICLE - 2+ VIEWS; DG C-ARM 1-60 MIN

[Series 1: run · 4 of 4 slices shown]
[im 1/4]
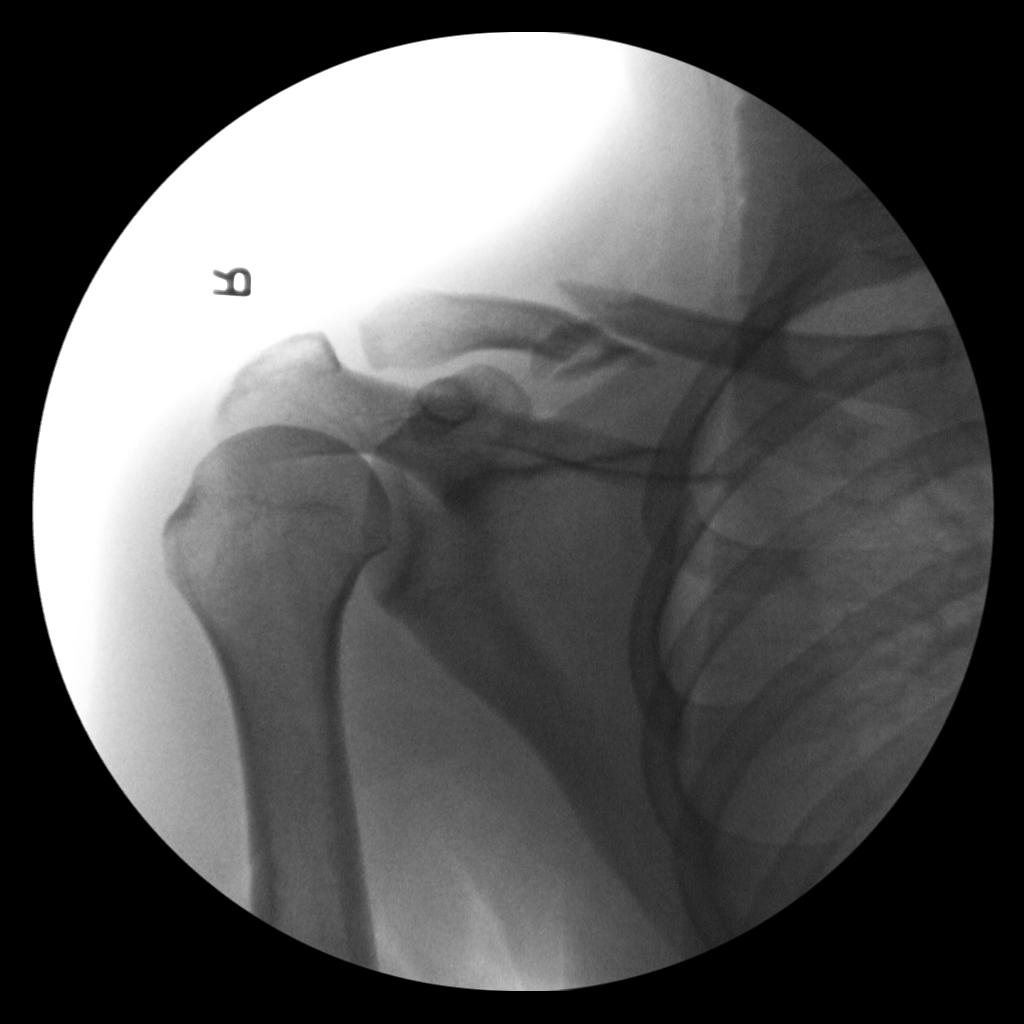
[im 2/4]
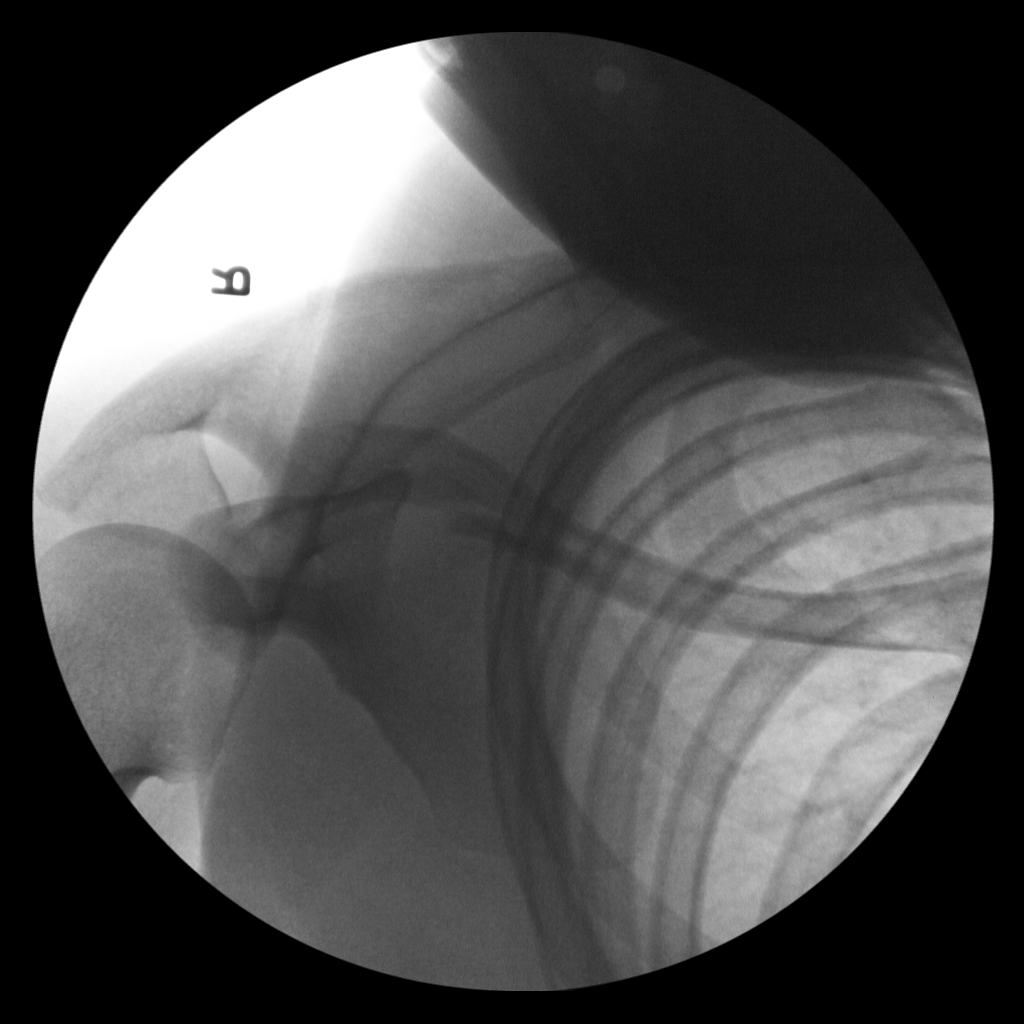
[im 3/4]
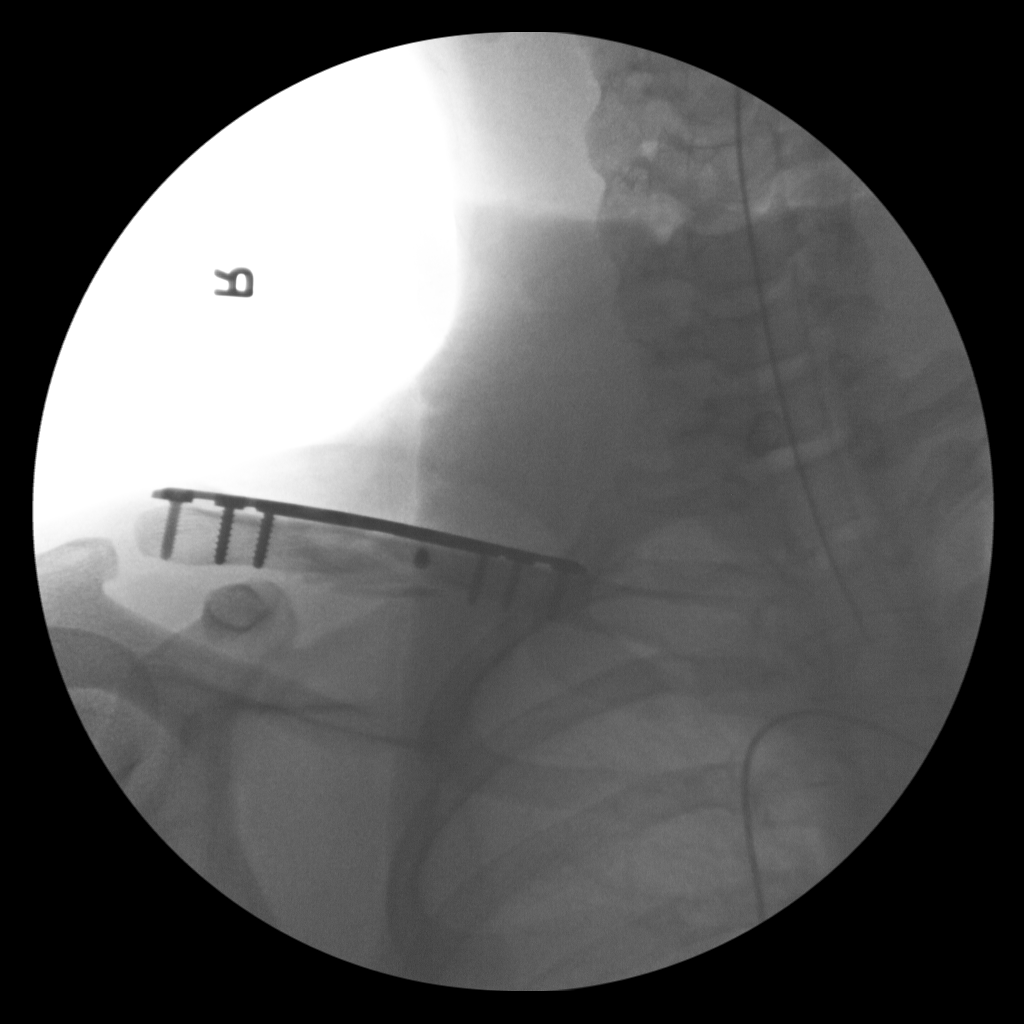
[im 4/4]
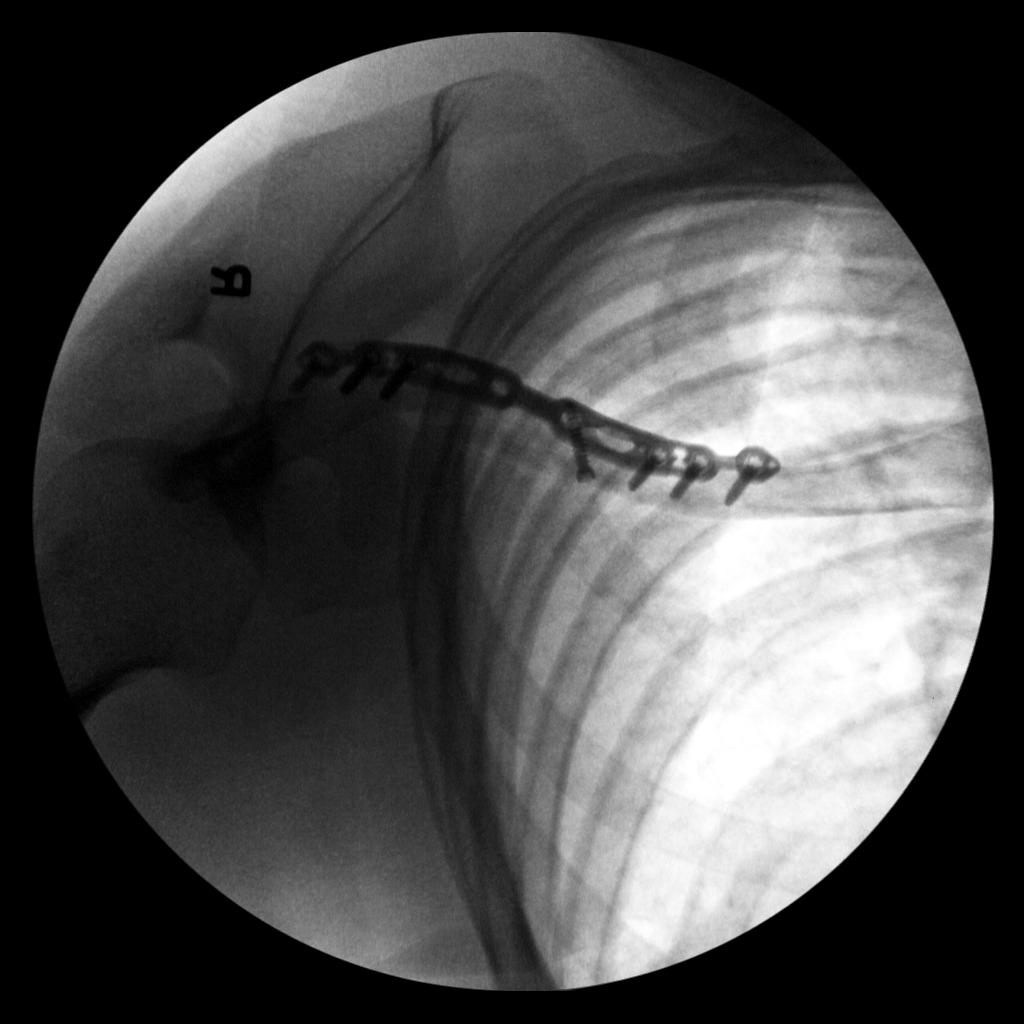

[4 of 4 positions shown; findings below may reference images not displayed]

FINDINGS: The mid right clavicular fracture has been reduced and repaired with
plate and screws during the study.
IMPRESSION: Repair of a right clavicular fracture.
